# Patient Record
Sex: Male | Born: 1965 | Race: Black or African American | Hispanic: No | Marital: Single | Smoking: Never smoker
Health system: Southern US, Community
[De-identification: ages and names within clinical notes are randomized; demographics above are authoritative.]

---

## 2013-08-31 ENCOUNTER — Encounter (HOSPITAL_COMMUNITY): Payer: Self-pay | Admitting: Emergency Medicine

## 2013-08-31 ENCOUNTER — Emergency Department (HOSPITAL_COMMUNITY)
Admission: EM | Admit: 2013-08-31 | Discharge: 2013-08-31 | Disposition: A | Discharge status: Home / Self Care | Payer: Self-pay | Attending: Emergency Medicine | Admitting: Emergency Medicine

## 2013-08-31 ENCOUNTER — Emergency Department (HOSPITAL_COMMUNITY): Payer: Self-pay

## 2013-08-31 DIAGNOSIS — R21 Rash and other nonspecific skin eruption: Secondary | ICD-10-CM | POA: Insufficient documentation

## 2013-08-31 DIAGNOSIS — J040 Acute laryngitis: Secondary | ICD-10-CM | POA: Insufficient documentation

## 2013-08-31 DIAGNOSIS — R197 Diarrhea, unspecified: Secondary | ICD-10-CM | POA: Insufficient documentation

## 2013-08-31 DIAGNOSIS — H9209 Otalgia, unspecified ear: Secondary | ICD-10-CM | POA: Insufficient documentation

## 2013-08-31 DIAGNOSIS — J40 Bronchitis, not specified as acute or chronic: Secondary | ICD-10-CM | POA: Insufficient documentation

## 2013-08-31 LAB — RAPID STREP SCREEN (MED CTR MEBANE ONLY): STREPTOCOCCUS, GROUP A SCREEN (DIRECT): NEGATIVE

## 2013-08-31 MED ORDER — LIDOCAINE VISCOUS 2 % MT SOLN
15.0000 mL | Freq: Once | OROMUCOSAL | Status: AC
  Administered 2013-08-31: 15 mL via OROMUCOSAL
  Filled 2013-08-31: qty 15

## 2013-08-31 MED ORDER — HYDROCODONE-HOMATROPINE 5-1.5 MG/5ML PO SYRP: 5.0000 mL | ORAL_SOLUTION | Freq: Four times a day (QID) | ORAL | Status: AC | PRN

## 2013-08-31 NOTE — ED Provider Notes (Signed)
CSN: [id number]     Arrival date & time 08/31/13  1247 History   First MD Initiated Contact with Patient 08/31/13 1255    This chart was scribed for Nathaniel PiccoloJennifer Hikari Tripp PA-C, a non-physician practitioner working with No att. providers found by Lewanda RifeAlexandra Hurtado, ED Scribe. This patient was seen in room TR08C/TR08C and the patient's care was started at 1:12 PM     Chief Complaint  Patient presents with  . Sore Throat   (Consider location/radiation/quality/duration/timing/severity/associated sxs/prior Treatment) The history is provided by the patient. No language interpreter was used.   HPI Comments: Nathaniel ShockJohnnie Anderson is a 48 y.o. male who presents to the Emergency Department complaining of constant unchanged sore throat onset 2 months. Reports associated recent dx of bronchitis, productive cough with white sputum, chest wall pain with cough, bilateral otalgia, and occasional blood streaked rhinorrhea. Additionally, reports watery diarrhea (4 episodes a day) for the past 3 days. Reports trying an inhaler with mild relief of symptoms. Denies any aggravating factors. Denies associated fever, ear drainage, and shortness of breath.     History reviewed. No pertinent past medical history. History reviewed. No pertinent past surgical history. No family history on file. History  Substance Use Topics  . Smoking status: Never Smoker   . Smokeless tobacco: Not on file  . Alcohol Use: Yes    Review of Systems  Constitutional: Negative for fever.  HENT: Positive for sore throat.   Psychiatric/Behavioral: Negative for confusion.  All other systems reviewed and are negative.    Allergies  Review of patient's allergies indicates no known allergies.  Home Medications   Current Outpatient Rx  Name  Route  Sig  Dispense  Refill  . HYDROcodone-homatropine (HYCODAN) 5-1.5 MG/5ML syrup   Oral   Take 5 mLs by mouth every 6 (six) hours as needed for cough.   120 mL   0    BP 109/79  Pulse 97   Temp(Src) 98.5 F (36.9 C) (Oral)  Resp 16  SpO2 97% Physical Exam  Constitutional: He is oriented to person, place, and time. He appears well-developed and well-nourished. No distress.  HENT:  Head: Normocephalic and atraumatic.  Right Ear: External ear normal.  Left Ear: External ear normal.  Nose: Nose normal.  Mouth/Throat: Uvula is midline and mucous membranes are normal. No trismus in the jaw. No uvula swelling. Posterior oropharyngeal erythema present. No oropharyngeal exudate or posterior oropharyngeal edema.  Palate petechial rash noted   Eyes: Conjunctivae are normal.  Neck: Normal range of motion. Neck supple.  Cardiovascular: Normal rate and regular rhythm.   Pulmonary/Chest: Effort normal and breath sounds normal. No respiratory distress. He has no wheezes. He has no rales.  Abdominal: Soft.  Musculoskeletal: Normal range of motion.  Neurological: He is alert and oriented to person, place, and time.  Skin: Skin is warm and dry. He is not diaphoretic.  Psychiatric: He has a normal mood and affect.     ED Course  Procedures (including critical care time) Medications  lidocaine (XYLOCAINE) 2 % viscous mouth solution 15 mL (15 mLs Mouth/Throat Given 08/31/13 1353)    COORDINATION OF CARE:  Nursing notes reviewed. Vital signs reviewed. Initial pt interview and examination performed.   1:15 PM-Discussed work up plan with pt at bedside, which includes rapid strep screen. Pt agrees with plan.   Treatment plan initiated: Medications  lidocaine (XYLOCAINE) 2 % viscous mouth solution 15 mL (15 mLs Mouth/Throat Given 08/31/13 1353)     Initial diagnostic testing ordered.  Labs Review Labs Reviewed  RAPID STREP SCREEN  CULTURE, GROUP A STREP   Imaging Review Dg Chest 2 View  08/31/2013   CLINICAL DATA:  Chest pain, recent bronchitis with continued cough  EXAM: CHEST  2 VIEW  COMPARISON:  None.  FINDINGS: The heart size and mediastinal contours are within normal  limits. Both lungs are clear. The visualized skeletal structures are unremarkable.  IMPRESSION: No active cardiopulmonary disease.   Electronically Signed   By: Esperanza Heir M.D.   On: 08/31/2013 13:40    EKG Interpretation   None       MDM   1. Laryngitis     Filed Vitals:   08/31/13 1309  BP: 109/79  Pulse: 97  Temp: 98.5 F (36.9 C)  Resp: 16    Afebrile, NAD, non-toxic appearing, AAOx4. Lungs CTA. No respiratory distress. Oropharynx clear. No evidence of PTA or other oropharygeal infection. CXR negative. Rapid strep negative. Symptoms managed in ED. Symptoms consistent with laryngitis. Will refer to ENT for not improving symptoms. Advised PCP f/u. Return precautions discussed. Patient is agreeable to plan. Patient is stable at time of discharge  I personally performed the services described in this documentation, which was scribed in my presence. The recorded information has been reviewed and is accurate.       Jeannetta Ellis, PA-C 08/31/13 1518

## 2013-08-31 NOTE — Discharge Instructions (Signed)
Please follow up with your primary care physician in 1-2 days. If you do not have one please call the [facility] number listed above. Please take medication as prescribed. Please follow up with Dr. Pollyann Kennedyosen to schedule a follow up appointment. Please read all discharge instructions and return precautions.    Laryngitis At the top of your windpipe is your voice box. It is the source of your voice. Inside your voice box are 2 bands of muscles called vocal cords. When you breathe, your vocal cords are relaxed and open so that air can get into the lungs. When you decide to say something, these cords come together and vibrate. The sound from these vibrations goes into your throat and comes out through your mouth as sound. Laryngitis is an inflammation of the vocal cords that causes hoarseness, cough, loss of voice, sore throat, and dry throat. Laryngitis can be temporary (acute) or long-term (chronic). Most cases of acute laryngitis improve with time.Chronic laryngitis lasts for more than 3 weeks. CAUSES Laryngitis can often be related to excessive smoking, talking, or yelling, as well as inhalation of toxic fumes and allergies. Acute laryngitis is usually caused by a viral infection, vocal strain, measles or mumps, or bacterial infections. Chronic laryngitis is usually caused by vocal cord strain, vocal cord injury, postnasal drip, growths on the vocal cords, or acid reflux. SYMPTOMS   Cough.  Sore throat.  Dry throat. RISK FACTORS  Respiratory infections.  Exposure to irritating substances, such as cigarette smoke, excessive amounts of alcohol, stomach acids, and workplace chemicals.  Voice trauma, such as vocal cord injury from shouting or speaking too loud. DIAGNOSIS  Your cargiver will perform a physical exam. During the physical exam, your caregiver will examine your throat. The most common sign of laryngitis is hoarseness. Laryngoscopy may be necessary to confirm the  diagnosis of this condition. This procedure allows your caregiver to look into the larynx. HOME CARE INSTRUCTIONS  Drink enough fluids to keep your urine clear or pale yellow.  Rest until you no longer have symptoms or as directed by your caregiver.  Breathe in moist air.  Take all medicine as directed by your caregiver.  Do not smoke.  Talk as little as possible (this includes whispering).  Write on paper instead of talking until your voice is back to normal.  Follow up with your caregiver if your condition has not improved after 10 days. SEEK MEDICAL CARE IF:   You have trouble breathing.  You cough up blood.  You have persistent fever.  You have increasing pain.  You have difficulty swallowing. MAKE SURE YOU:  Understand these instructions.  Will watch your condition.  Will get help right away if you are not doing well or get worse. Document Released: 07/20/2005 Document Revised: 10/12/2011 Document Reviewed: 09/25/2010 [facility] Patient Information 2014 [employer] [place] [place].

## 2013-08-31 NOTE — ED Notes (Signed)
Pt reports sore throat and bronchitis since early December. Not improving.

## 2013-08-31 NOTE — ED Notes (Signed)
Patient transported to X-ray 

## 2013-08-31 NOTE — ED Notes (Signed)
Per pt. States he is coughing up small amounts of blood and sputum. Pt has hoarse voice. X 2 months. 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

## 2013-09-01 NOTE — ED Provider Notes (Signed)
Medical screening examination/treatment/procedure(s) were performed by non-physician practitioner and as supervising physician I was immediately available for consultation/collaboration.  EKG Interpretation   None         Nathaniel B. Bernette MayersSheldon, MD 09/01/13 [phone]

## 2013-09-02 LAB — CULTURE, GROUP A STREP

## 2013-09-12 ENCOUNTER — Ambulatory Visit: Payer: Self-pay

## 2013-09-26 ENCOUNTER — Ambulatory Visit: Payer: Self-pay | Admitting: Internal Medicine

## 2013-09-26 ENCOUNTER — Ambulatory Visit: Payer: Self-pay

## 2013-11-14 ENCOUNTER — Ambulatory Visit: Payer: Self-pay

## 2013-12-12 ENCOUNTER — Ambulatory Visit: Payer: Self-pay | Admitting: Internal Medicine

## 2015-03-26 IMAGING — CR DG CHEST 2V
2 series · 2 of 2 positions shown · non-contrast
Comparison: None.

CLINICAL DATA: Chest pain, recent bronchitis with continued cough

EXAM:
CHEST  2 VIEW

[w chest pa]
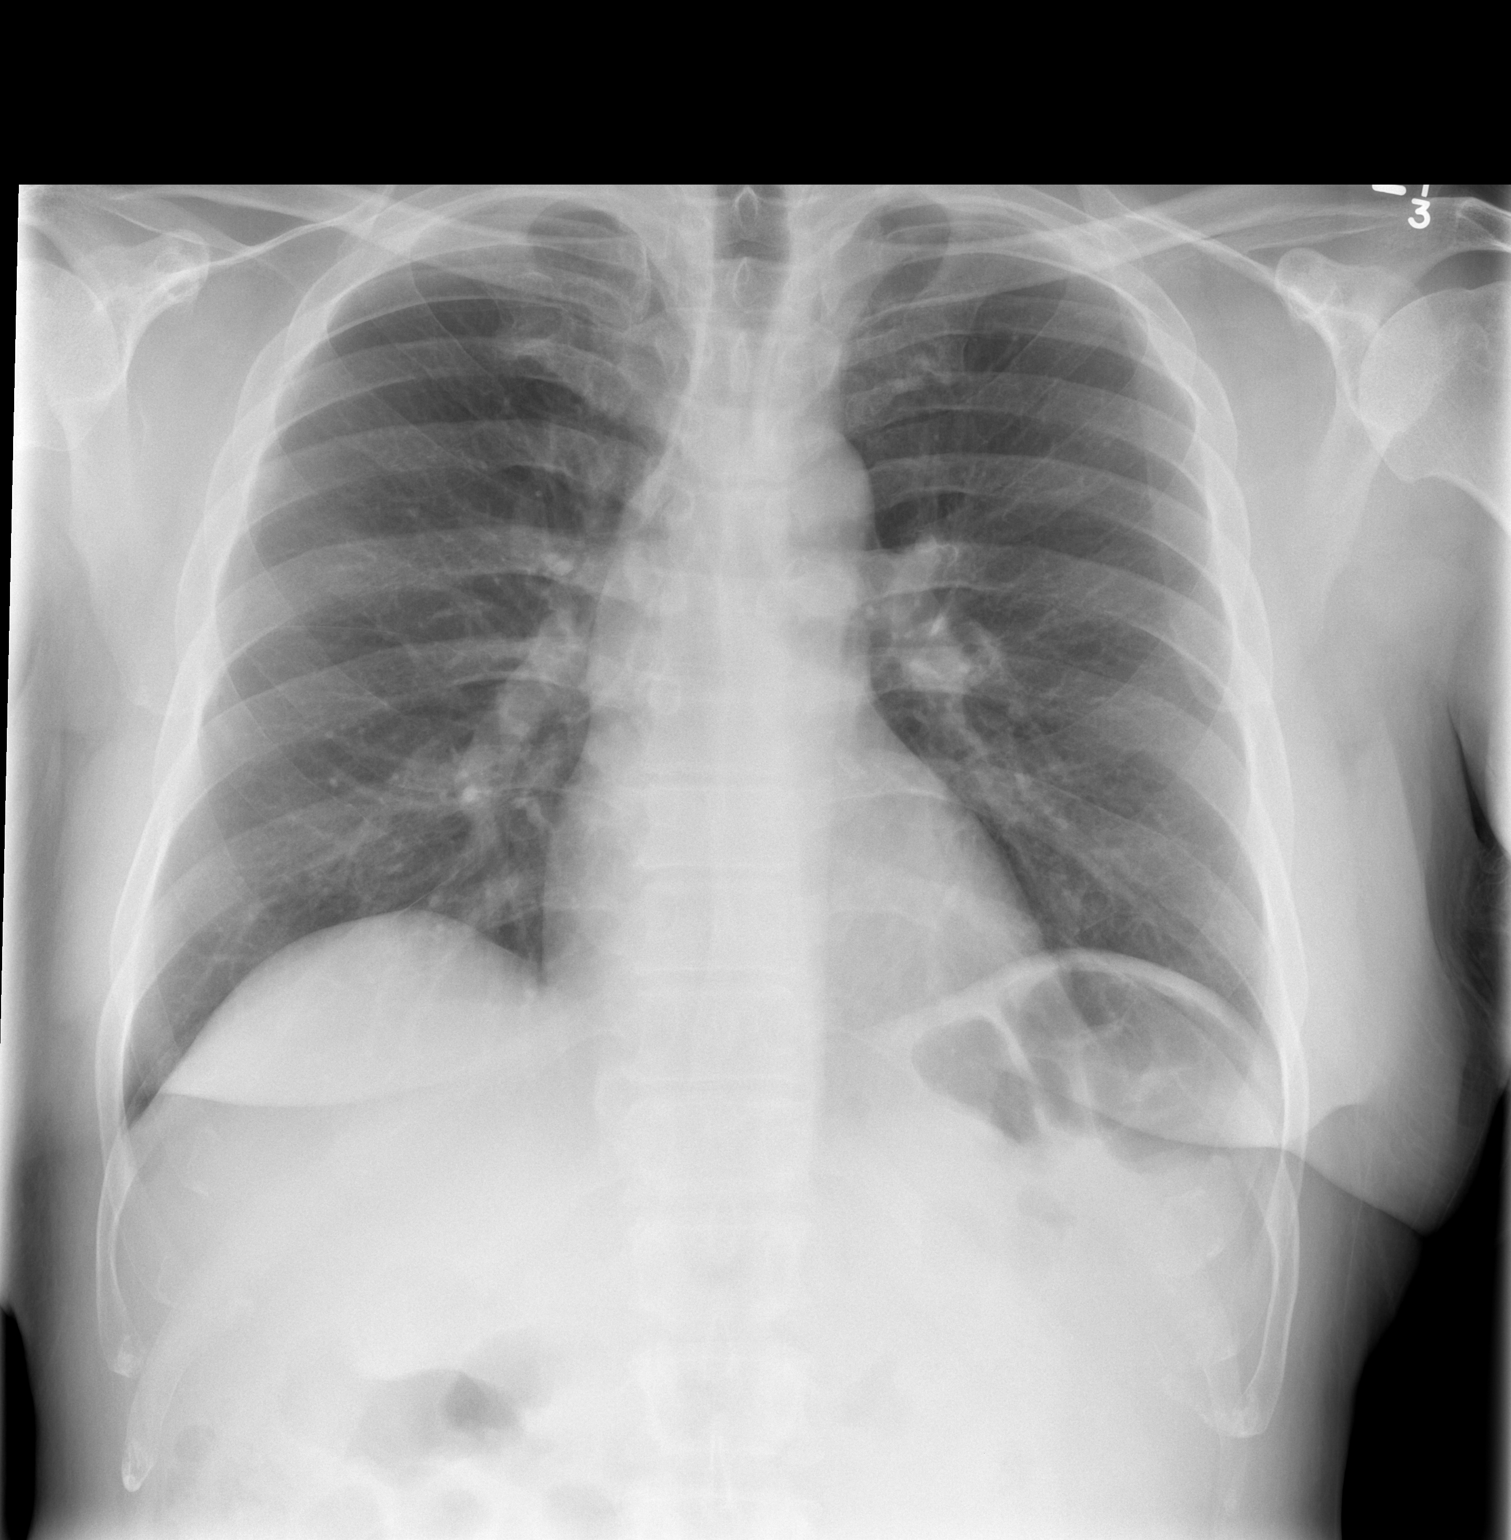

[w chest lat]
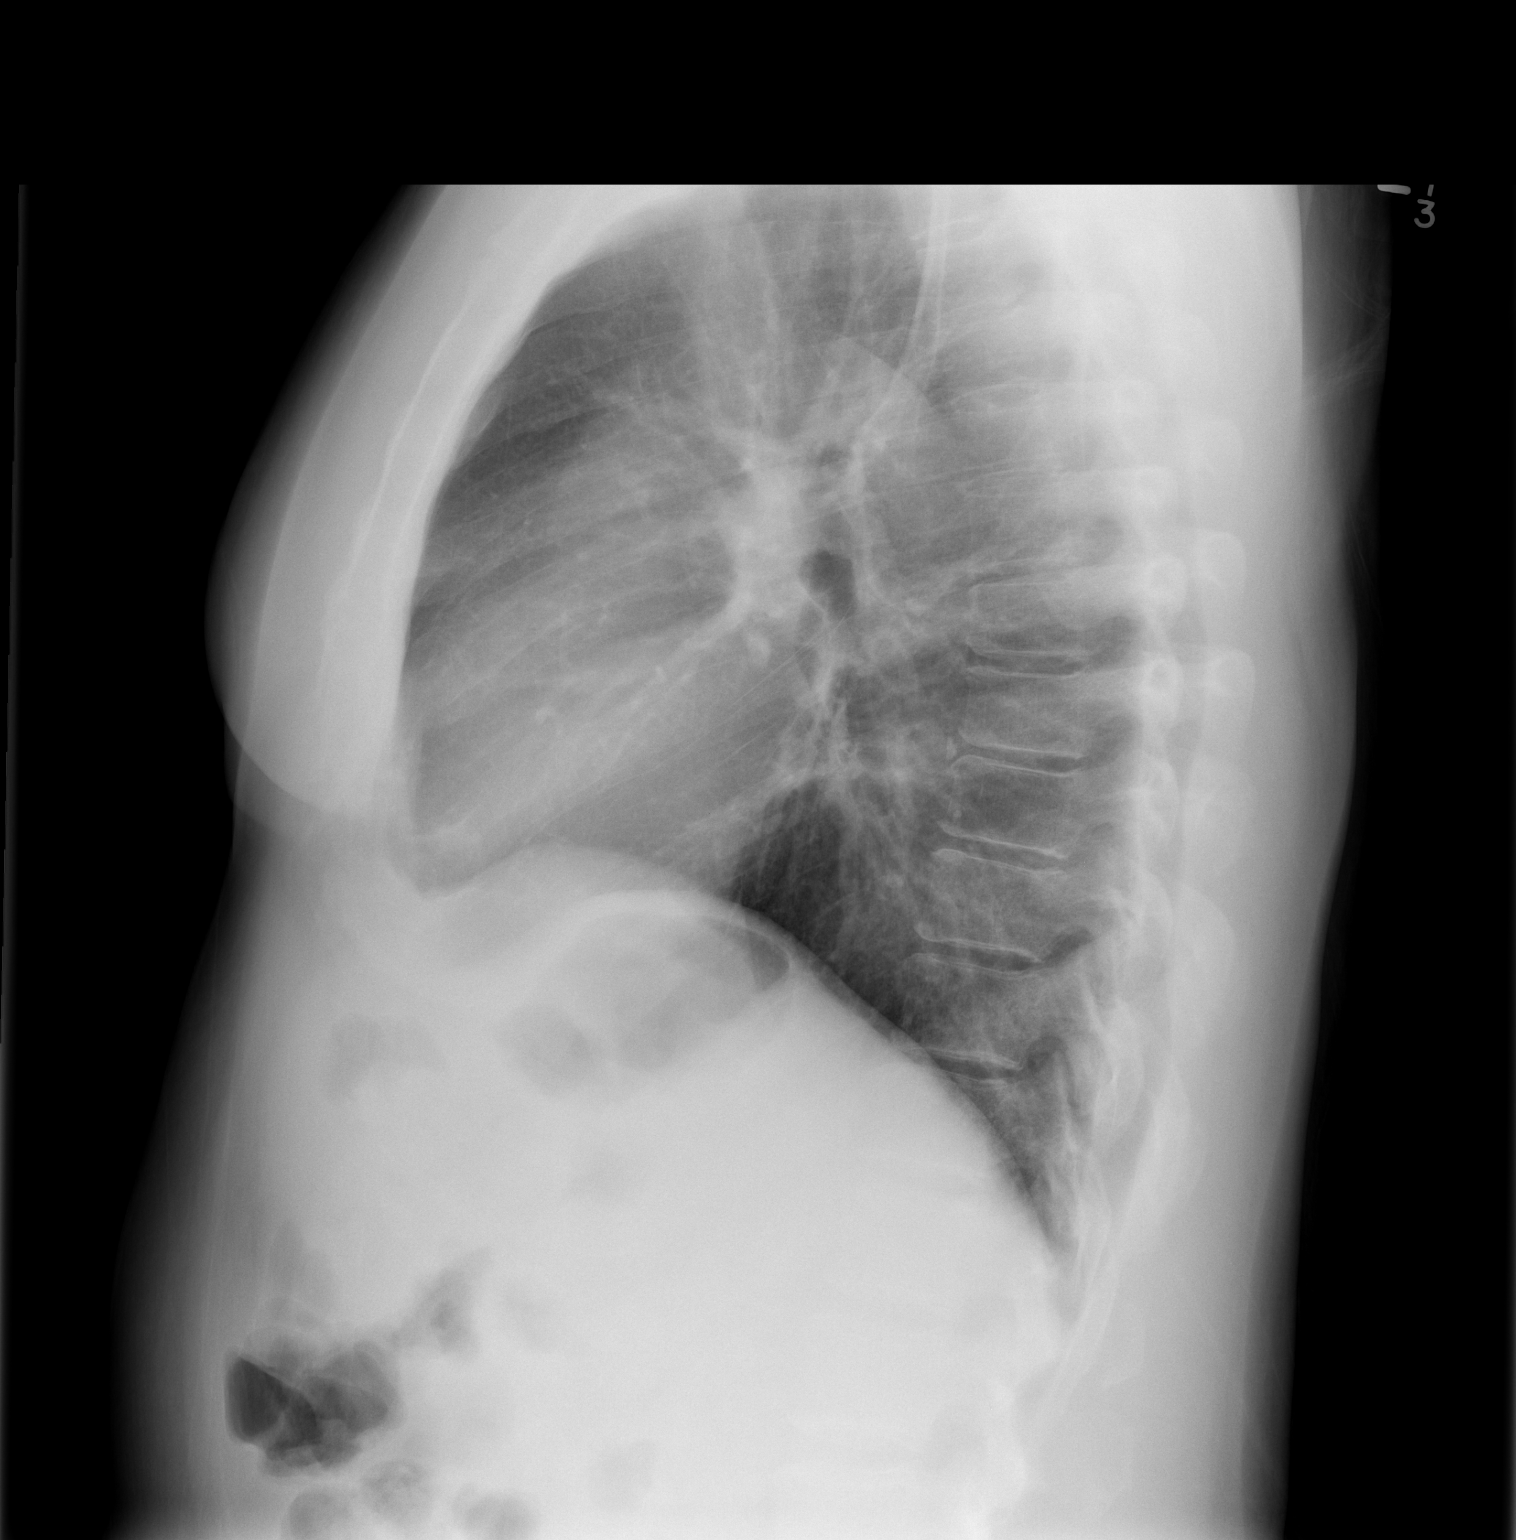

[2 of 2 positions shown; findings below may reference images not displayed]

FINDINGS: The heart size and mediastinal contours are within normal limits.
Both lungs are clear. The visualized skeletal structures are
unremarkable.
IMPRESSION: No active cardiopulmonary disease.

## 2016-01-02 DEATH — deceased
